# Patient Record
Sex: Male | Born: 1990 | Race: Black or African American | Hispanic: No | Marital: Single
Health system: Midwestern US, Community
[De-identification: ages and names within clinical notes are randomized; demographics above are authoritative.]

---

## 2013-06-29 LAB — URINALYSIS W/ RFLX MICROSCOPIC
Bilirubin: NEGATIVE
Blood: NEGATIVE
Glucose: NEGATIVE mg/dL
Ketone: NEGATIVE mg/dL
Leukocyte Esterase: NEGATIVE
Nitrites: NEGATIVE
Protein: NEGATIVE mg/dL
Specific gravity: 1.02 (ref 1.003–1.030)
Urobilinogen: 0.2 EU/dL (ref 0.2–1.0)
pH (UA): 7 (ref 5.0–8.0)

## 2013-06-29 NOTE — ED Notes (Signed)
Pt requesting blood test for herpes. Reports recent exposure.

## 2013-06-29 NOTE — ED Provider Notes (Signed)
HPI Comments: Anthony Chandler is a 23 y.o. male presents to the emergency department requesting a blood test for herpes. Pt reports no symptoms that he knows of. Pt reports recent contact with herpes approximately 1 week ago. Per patient, sexual partner is positive for herpes, but was not having an active outbreak at the time. Pt also reports recent swollen lymph nodes. No other symptoms or complaints were presented at this time.       Patient is a 23 y.o. male presenting with STD exposure. The history is provided by the patient.   Exposure to STD  Pertinent negatives include no nausea, no vomiting and no abdominal pain.        History reviewed. No pertinent past medical history.     History reviewed. No pertinent past surgical history.      History reviewed. No pertinent family history.     History     Social History   ??? Marital Status: SINGLE     Spouse Name: N/A     Number of Children: N/A   ??? Years of Education: N/A     Occupational History   ??? Not on file.     Social History Main Topics   ??? Smoking status: Not on file   ??? Smokeless tobacco: Not on file   ??? Alcohol Use: Not on file   ??? Drug Use: Not on file   ??? Sexual Activity: Not on file     Other Topics Concern   ??? Not on file     Social History Narrative   ??? No narrative on file                  ALLERGIES: Review of patient's allergies indicates no known allergies.      Review of Systems   Constitutional: Negative.  Negative for fever, chills and diaphoresis.   HENT: Negative.  Negative for congestion, ear pain, sore throat and trouble swallowing.    Eyes: Negative.    Respiratory: Negative.  Negative for cough, chest tightness, shortness of breath and wheezing.    Cardiovascular: Negative.  Negative for chest pain and palpitations.   Gastrointestinal: Negative.  Negative for nausea, vomiting, abdominal pain, diarrhea and blood in stool.   Genitourinary: Negative for dysuria and frequency.        Pt denies symptoms related to herpes.   Musculoskeletal: Negative.   Negative for back pain, joint swelling and neck pain.   Skin: Negative.    Neurological: Negative.  Negative for seizures, syncope and headaches.   Psychiatric/Behavioral: Negative.  Negative for behavioral problems and confusion. The patient is not nervous/anxious.    All other systems reviewed and are negative.      Filed Vitals:    06/29/13 1343   BP: 122/75   Pulse: 65   Temp: 97.3 ??F (36.3 ??C)   Resp: 18   Height: 5\' 11"  (1.803 m)   Weight: 90.719 kg (200 lb)   SpO2: 99%            Physical Exam   Constitutional: He is oriented to person, place, and time. He appears well-developed and well-nourished. No distress.   HENT:   Head: Normocephalic and atraumatic.   Right Ear: External ear normal.   Left Ear: External ear normal.   Nose: Nose normal.   Mouth/Throat: Oropharynx is clear and moist. No oropharyngeal exudate.   Eyes: Conjunctivae and EOM are normal. Pupils are equal, round, and reactive to light. Right eye exhibits  no discharge. Left eye exhibits no discharge. No scleral icterus.   Neck: Normal range of motion. Neck supple. No JVD present. No tracheal deviation present. No thyromegaly present.   Cardiovascular: Normal rate, regular rhythm, normal heart sounds and intact distal pulses.  Exam reveals no gallop and no friction rub.    No murmur heard.  Pulmonary/Chest: Effort normal and breath sounds normal. No stridor. No respiratory distress. He has no wheezes. He has no rales. He exhibits no tenderness.   Abdominal: Soft. Bowel sounds are normal. He exhibits no distension and no mass. There is no tenderness. There is no rebound and no guarding.   Genitourinary: Penis normal.   Musculoskeletal: Normal range of motion. He exhibits no edema or tenderness.   Lymphadenopathy:     He has no cervical adenopathy.   Neurological: He is alert and oriented to person, place, and time. He has normal reflexes. No cranial nerve deficit. He exhibits normal muscle tone. Coordination normal.   Skin: Skin is warm and dry.  No rash noted. He is not diaphoretic. No erythema. No pallor.   Psychiatric: His behavior is normal. Judgment and thought content normal.   anxious   Nursing note and vitals reviewed.       MDM  Number of Diagnoses or Management Options  Anxiety state, unspecified:   Concern about STD in male without diagnosis:   Tobacco use disorder:   Diagnosis management comments: Differential includes: STD exposure, Anxiety.       Procedures    PROGRESS NOTES  1:47 PM: Llana Aliment, DO arrives to the bedside to evaluate the patient. Answered the patient's questions regarding the treatment plan.  2:07 PM: Pt stated he has had multiple exposures with the same partner for over one month, but the last expose was last week. Dr. Kemper Durie educated him on the incubation period. Will discharge patient with instructions to follow up with health department for any STD screenings and testings.         CONSULTATIONS  None      ED PHYSICIAN ORDERS  Orders Placed This Encounter   ??? CHLAMYDIA/GC AMPLIFIED     Standing Status: Standing      Number of Occurrences: 1      Standing Expiration Date:      Order Specific Question:  Sample type     Answer:  Urine [258]     Order Specific Question:  Specimen source     Answer:  Urine [258]   ??? URINALYSIS W/ RFLX MICROSCOPIC     Standing Status: Standing      Number of Occurrences: 1      Standing Expiration Date:            MEDICATIONS ORDERED  Medications - No data to display        RADIOLOGY INTERPRETATIONS  No orders to display           EKG READINGS/LABORATORY RESULTS  No results found for this or any previous visit (from the past 12 hour(s)).      ED DIAGNOSIS & DISPOSITION INFORMATION  Diagnosis:   1. Concern about STD in male without diagnosis    2. Anxiety state, unspecified    3. Tobacco use disorder          Disposition: Discharged.    Follow-up Information    Follow up With Details Comments Contact Info    [facility] DEPARTMENT Schedule an appointment as soon as possible for a visit  [address]  [address]  [place] [id number]  [phone]    [facility]  As needed, If symptoms worsen [address]  [place] [id number]  [phone]          Patient's Medications    No medications on file         SCRIBE ATTESTATION STATEMENT  Documented by: Claudia PollockAllison Kuipers, scribing for and in the presence of Llana Alimentlarence Carys Malina, DO. (1:47 PM)    PROVIDER ATTESTATION STATEMENT  I personally performed the services described in the documentation, reviewed the documentation, as recorded by the scribe in my presence, and it accurately and completely records my words and actions.  Llana Alimentlarence Bekki Tavenner, DO.

## 2013-06-30 LAB — CHLAMYDIA/GC PCR
Chlamydia amplified: NEGATIVE
N. gonorrhea, amplified: NEGATIVE

## 2013-09-07 MED ORDER — CEFTRIAXONE 250 MG SOLUTION FOR INJECTION
250 mg | INTRAMUSCULAR | Status: AC
Start: 2013-09-07 — End: 2013-09-07
  Administered 2013-09-07: 18:00:00 via INTRAMUSCULAR

## 2013-09-07 MED ORDER — AZITHROMYCIN 250 MG TAB
250 mg | ORAL | Status: AC
Start: 2013-09-07 — End: 2013-09-07
  Administered 2013-09-07: 18:00:00 via ORAL

## 2013-09-07 MED FILL — AZITHROMYCIN 250 MG TAB: 250 mg | ORAL | Qty: 4

## 2013-09-07 MED FILL — CEFTRIAXONE 250 MG SOLUTION FOR INJECTION: 250 mg | INTRAMUSCULAR | Qty: 250

## 2013-09-07 NOTE — ED Notes (Signed)
Penile discharge, discomfort with urination for the past two weeks.

## 2013-09-07 NOTE — ED Provider Notes (Signed)
HPI Comments: Anthony RhodyJames Chandler is a 23 y.o. Male with no PMHx who presents to the ED c/o penile discharge. Pt states he had unprotected sex 3 weeks ago and has had penile discharge for the past two weeks. Pt also notes "some discomfort" when urinating. Pt denies any flank pain, hematuria, fever, NVD. No other complaints expressed at this time.     Patient is a 23 y.o. male presenting with penile problem.   Penis Injury   Pertinent negatives include no diaphoresis, no nausea, no vomiting, no abdominal pain, no frequency and no diarrhea.          Past Medical History   Diagnosis Date   ??? Bronchitis         History reviewed. No pertinent past surgical history.      History reviewed. No pertinent family history.     History     Social History   ??? Marital Status: SINGLE     Spouse Name: N/A     Number of Children: N/A   ??? Years of Education: N/A     Occupational History   ??? Not on file.     Social History Main Topics   ??? Smoking status: Not on file   ??? Smokeless tobacco: Not on file   ??? Alcohol Use: Not on file   ??? Drug Use: Not on file   ??? Sexual Activity: Not on file     Other Topics Concern   ??? Not on file     Social History Narrative        ALLERGIES: Review of patient's allergies indicates no known allergies.      Review of Systems   Constitutional: Negative.  Negative for fever, chills and diaphoresis.   HENT: Negative.  Negative for congestion, ear pain, sore throat and trouble swallowing.    Eyes: Negative.  Negative for photophobia, pain, redness and visual disturbance.   Respiratory: Negative.  Negative for cough, chest tightness, shortness of breath and wheezing.    Cardiovascular: Negative.  Negative for chest pain and palpitations.   Gastrointestinal: Negative.  Negative for nausea, vomiting, abdominal pain, diarrhea and blood in stool.   Genitourinary: Positive for discharge. Negative for frequency.   Musculoskeletal: Negative.  Negative for back pain, joint swelling and neck pain.   Skin: Negative.     Neurological: Negative.  Negative for seizures, syncope and headaches.   Psychiatric/Behavioral: Negative.  Negative for behavioral problems and confusion. The patient is not nervous/anxious.    All other systems reviewed and are negative.      Filed Vitals:    09/07/13 1348   BP: 136/86   Pulse: 56   Temp: 98.3 ??F (36.8 ??C)   Resp: 18   Height: 5\' 6"  (1.676 m)   Weight: 86.183 kg (190 lb)   SpO2: 100%            Physical Exam   Constitutional: He is oriented to person, place, and time. He appears well-developed and well-nourished.   HENT:   Head: Normocephalic.   Mouth/Throat: No oropharyngeal exudate.   Eyes: Pupils are equal, round, and reactive to light.   Neck: Normal range of motion. Neck supple.   Cardiovascular: Normal rate, regular rhythm, normal heart sounds and intact distal pulses.  Exam reveals no gallop and no friction rub.    No murmur heard.  Pulmonary/Chest: Effort normal. No respiratory distress. He has no wheezes. He has no rales. He exhibits no tenderness.   Abdominal: Soft. Bowel  sounds are normal. He exhibits no distension. There is no tenderness. There is no rebound and no guarding.   Genitourinary:   No discharge; no lesions; no inguinal adenopathy; testicles normal   Musculoskeletal: Normal range of motion. He exhibits no edema or tenderness.   Neurological: He is alert and oriented to person, place, and time.   Skin: Skin is warm and dry. No rash noted. He is not diaphoretic. No erythema.   Psychiatric: He has a normal mood and affect.   Nursing note and vitals reviewed.       MDM    Procedures    -------------------------------------------------------------------------------------------------------------------     RADIOLOGY RESULTS:   No orders to display       LABORATORY/EKG RESULTS:  No results found for this or any previous visit (from the past 12 hour(s)).    ED Objective Order Summary:     Orders Placed This Encounter   ??? CHLAMYDIA/GC AMPLIFIED     Standing Status: Standing       Number of Occurrences: 1      Standing Expiration Date:      Order Specific Question:  Sample type     Answer:  Urine [258]     Order Specific Question:  Specimen source     Answer:  Urine [258]   ??? cefTRIAXone (ROCEPHIN) injection 250 mg     Sig:    ??? azithromycin (ZITHROMAX) tablet 1,000 mg     Sig:        CONSULTATIONS:  none    PROGRESS NOTES:  1:45 PM:  Dr. Amalia GreenhouseHumberto Myishia Kasik, MD answered the patient's questions regarding treatment.  2:38 PM: Pt treated in ED. Stable and ready for discharge.     DISPOSITION:  ED DIAGNOSIS & DISPOSITION INFORMATION  Diagnosis:   1. Potential exposure to STD          Disposition: HOME    Follow-up Information    Follow up With Details Comments Contact Info    [facility] Schedule an appointment as soon as possible for a visit in 1 day RETURN TO ER IMMEDIATELY IF ANY WORSENING SYMPTOMS [address]  [place] [id number]  [phone]          Patient's Medications    No medications on file       SCRIBE ATTESTATION STATEMENT:  Documented by: Edward QualiaKaitlyn Gavaza scribing for and in the presence of Amalia GreenhouseHumberto Pankaj Haack, MD.    PROVIDER ATTESTATION STATEMENT:  I personally performed the services described in the documentation, reviewed the documentation, as recorded by the scribe in my presence, and it accurately and completely records my words and actions Amalia GreenhouseHumberto Riyansh Gerstner, MD. 8:41 PM

## 2013-09-07 NOTE — ED Notes (Signed)
Pt educated about STD prevention.

## 2013-09-08 LAB — CHLAMYDIA/GC PCR
Chlamydia amplified: NEGATIVE
N. gonorrhea, amplified: NEGATIVE

## 2019-03-14 ENCOUNTER — Other Ambulatory Visit: Payer: Self-pay

## 2019-03-14 ENCOUNTER — Encounter: Payer: Self-pay | Admitting: Emergency Medicine

## 2019-03-14 ENCOUNTER — Ambulatory Visit (INDEPENDENT_AMBULATORY_CARE_PROVIDER_SITE_OTHER): Payer: Self-pay

## 2019-03-14 ENCOUNTER — Ambulatory Visit
Admission: EM | Admit: 2019-03-14 | Discharge: 2019-03-14 | Disposition: A | Discharge status: Home / Self Care | Payer: Self-pay | Attending: Urgent Care | Admitting: Urgent Care

## 2019-03-14 DIAGNOSIS — G501 Atypical facial pain: Secondary | ICD-10-CM

## 2019-03-14 DIAGNOSIS — R519 Headache, unspecified: Secondary | ICD-10-CM
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
## 2019-05-03 ENCOUNTER — Ambulatory Visit: Payer: Self-pay
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
